# Patient Record
Sex: Male | Born: 2005 | Race: Black or African American | Hispanic: No | Marital: Single | Smoking: Never smoker
Health system: Southern US, Community
[De-identification: ages and names within clinical notes are randomized; demographics above are authoritative.]

## PROBLEM LIST (undated history)

## (undated) DIAGNOSIS — T7840XA Allergy, unspecified, initial encounter: Secondary | ICD-10-CM

## (undated) DIAGNOSIS — J45909 Unspecified asthma, uncomplicated: Secondary | ICD-10-CM

## (undated) HISTORY — DX: Allergy, unspecified, initial encounter: T78.40XA

---
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
## 2023-01-07 ENCOUNTER — Ambulatory Visit: Payer: Medicaid Other | Admitting: Dermatology

## 2023-01-08 ENCOUNTER — Other Ambulatory Visit: Payer: Self-pay | Admitting: Family Medicine

## 2023-01-08 DIAGNOSIS — J4550 Severe persistent asthma, uncomplicated: Secondary | ICD-10-CM

## 2023-01-08 NOTE — Telephone Encounter (Signed)
Requested Prescriptions  Refused Prescriptions Disp Refills   montelukast (SINGULAIR) 10 MG tablet [Pharmacy Med Name: MONTELUKAST SOD 10 MG TABLET] 90 tablet 1    Sig: TAKE 1 TABLET BY MOUTH EVERYDAY AT BEDTIME     Pulmonology:  Leukotriene Inhibitors Passed - 01/08/2023  8:31 AM      Passed - Valid encounter within last 12 months    Recent Outpatient Visits           4 weeks ago Severe persistent asthma without complication   [place] [facility] Merita Norton T, FNP   10 months ago Encounter for routine child health examination with abnormal findings   [facility] Jacky Kindle, FNP   1 year ago Atopic dermatitis, unspecified type   [facility] Merita Norton T, FNP   1 year ago Intermittent asthma without complication, unspecified asthma severity   [facility] Chrismon, Jodell Cipro, PA-C   2 years ago Routine health maintenance   [facility] Fl[place], Eula Fried, [place]

## 2023-01-12 ENCOUNTER — Ambulatory Visit: Payer: Medicaid Other | Admitting: Dermatology

## 2023-05-31 ENCOUNTER — Ambulatory Visit (INDEPENDENT_AMBULATORY_CARE_PROVIDER_SITE_OTHER): Payer: Medicaid Other | Admitting: Family Medicine

## 2023-05-31 VITALS — BP 123/72 | HR 82 | Ht 74.0 in | Wt 165.0 lb

## 2023-05-31 DIAGNOSIS — R238 Other skin changes: Secondary | ICD-10-CM | POA: Diagnosis not present

## 2023-05-31 DIAGNOSIS — L301 Dyshidrosis [pompholyx]: Secondary | ICD-10-CM | POA: Insufficient documentation

## 2023-05-31 DIAGNOSIS — L300 Nummular dermatitis: Secondary | ICD-10-CM | POA: Insufficient documentation

## 2023-05-31 MED ORDER — DOXYCYCLINE HYCLATE 100 MG PO TABS
100.0000 mg | ORAL_TABLET | Freq: Two times a day (BID) | ORAL | 0 refills | Status: DC
Start: 2023-05-31 — End: 2024-03-22

## 2023-05-31 MED ORDER — CLOBETASOL PROPIONATE 0.05 % EX OINT
1.0000 | TOPICAL_OINTMENT | Freq: Two times a day (BID) | CUTANEOUS | 0 refills | Status: AC
Start: 2023-05-31 — End: ?

## 2023-05-31 NOTE — Progress Notes (Signed)
Established patient visit   Patient: Scott Rojas   DOB: 05/20/2006   17 y.o. Male  MRN: [id number] Visit Date: 05/31/2023  Today's healthcare provider: Jacky Kindle, FNP  Re Introduced to nurse practitioner role and practice setting.  All questions answered.  Discussed provider/patient relationship and expectations.  Subjective    HPI HPI   Pt stated-- had tattoo done in August-- left hand itching, bump trying pop--tried hydrocortisone.. Last edited by Shelly Bombard, CMA on 05/31/2023  2:28 PM.      Ongoing symptoms since 8/24; reports some pus filled fluid and bleeding with expression. 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 Mom presents with patient with concerns for 'hand amputation' if not treated  Medications: Outpatient Medications Prior to Visit  Medication Sig   albuterol (PROVENTIL) (2.5 MG/3ML) 0.083% nebulizer solution Take 3 mLs (2.5 mg total) by nebulization every 6 (six) hours as needed for wheezing.   albuterol (VENTOLIN HFA) 108 (90 Base) MCG/ACT inhaler Inhale 1-2 puffs into the lungs every 6 (six) hours as needed for wheezing or shortness of breath.   cetirizine (ZYRTEC) 10 MG tablet Take 1 tablet (10 mg total) by mouth daily.   fluticasone (FLONASE) 50 MCG/ACT nasal spray Place 2 sprays into both nostrils daily.   fluticasone (FLOVENT HFA) 220 MCG/ACT inhaler Inhale 1 puff into the lungs 2 (two) times daily as needed.   fluticasone-salmeterol (ADVAIR DISKUS) 250-50 MCG/ACT AEPB Inhale 1 puff into the lungs in the morning and at bedtime.   montelukast (SINGULAIR) 10 MG tablet Take 1 tablet (10 mg total) by mouth at bedtime.   triamcinolone cream (KENALOG) 0.1 % Apply 1 application. topically at bedtime. Pea size amount after cleaning.   Vitamin D, Ergocalciferol, (DRISDOL) 1.25 MG (50000 UNIT) CAPS capsule TAKE 1 CAPSULE (50,000 UNITS TOTAL) BY MOUTH EVERY 7 (SEVEN) DAYS   No facility-administered medications prior to  visit.     Objective    BP 123/72 (BP Location: Right Arm, Patient Position: Sitting, Cuff Size: Normal)   Pulse 82   Ht 6\' 2"  (1.88 m)   Wt 165 lb (74.8 kg)   SpO2 97%   BMI 21.18 kg/m    Physical Exam Constitutional:      General: He is not in acute distress.    Appearance: Normal appearance. He is normal weight. He is not ill-appearing, toxic-appearing or diaphoretic.  Cardiovascular:     Rate and Rhythm: Normal rate.  Pulmonary:     Effort: Pulmonary effort is normal.  Skin:    General: Skin is dry.     Capillary Refill: Capillary refill takes less than 2 seconds.     Findings: Lesion and rash present.     Comments: See L hand photo  Neurological:     General: No focal deficit present.     Mental Status: He is alert and oriented to person, place, and time. Mental status is at baseline.     No results found for any visits on 05/31/23.  Assessment & Plan     Problem List Items Addressed This Visit       Musculoskeletal and Integument   Dyshidrotic eczema - Primary   Relevant Medications   doxycycline (VIBRA-TABS) 100 MG tablet   Other Visit Diagnoses     Irritation symptom of skin       Relevant Medications   doxycycline (VIBRA-TABS) 100 MG tablet   clobetasol ointment (TEMOVATE) 0.05 %  Acute self limiting; denies other areas of concerns Recommend topical steroid and ABX Known hx of other eczema symptoms and known asthma Denies fevers or chills; however, notes expressed pus from hand/fingers.  Return if symptoms worsen or fail to improve.     Leilani Merl, FNP, have reviewed all documentation for this visit. The documentation on 05/31/23 for the exam, diagnosis, procedures, and orders are all accurate and complete.  Jacky Kindle, FNP  [facility] [phone] (phone) [phone] (fax)  [facility]

## 2023-06-01 ENCOUNTER — Encounter: Payer: Self-pay | Admitting: Family Medicine

## 2023-06-01 DIAGNOSIS — J4520 Mild intermittent asthma, uncomplicated: Secondary | ICD-10-CM

## 2023-06-01 MED ORDER — ALBUTEROL SULFATE (2.5 MG/3ML) 0.083% IN NEBU
2.5000 mg | INHALATION_SOLUTION | Freq: Four times a day (QID) | RESPIRATORY_TRACT | 1 refills | Status: DC | PRN
Start: 2023-06-01 — End: 2024-03-22

## 2023-09-28 ENCOUNTER — Encounter (INDEPENDENT_AMBULATORY_CARE_PROVIDER_SITE_OTHER): Payer: Self-pay

## 2023-10-11 ENCOUNTER — Encounter (INDEPENDENT_AMBULATORY_CARE_PROVIDER_SITE_OTHER): Payer: Self-pay

## 2024-02-16 ENCOUNTER — Telehealth: Payer: Self-pay

## 2024-02-16 NOTE — Telephone Encounter (Signed)
 Copied from CRM [id number]. Topic: Clinical - Medical Advice >> Feb 16, 2024  9:46 AM Emylou G wrote: Reason for CRM: Mom called.. wanted to make sure he is up to date for phys, yearly checkup and or bloodwork / vaccinations

## 2024-02-16 NOTE — Telephone Encounter (Signed)
 Due for CPE, please schedule appt

## 2024-03-22 ENCOUNTER — Ambulatory Visit (INDEPENDENT_AMBULATORY_CARE_PROVIDER_SITE_OTHER): Admitting: Family Medicine

## 2024-03-22 ENCOUNTER — Encounter: Payer: Self-pay | Admitting: Family Medicine

## 2024-03-22 VITALS — BP 132/75 | HR 68 | Resp 16 | Ht 74.0 in | Wt 176.9 lb

## 2024-03-22 DIAGNOSIS — Z1159 Encounter for screening for other viral diseases: Secondary | ICD-10-CM

## 2024-03-22 DIAGNOSIS — Z0000 Encounter for general adult medical examination without abnormal findings: Secondary | ICD-10-CM

## 2024-03-22 DIAGNOSIS — Z114 Encounter for screening for human immunodeficiency virus [HIV]: Secondary | ICD-10-CM

## 2024-03-22 DIAGNOSIS — E559 Vitamin D deficiency, unspecified: Secondary | ICD-10-CM

## 2024-03-22 DIAGNOSIS — Q386 Other congenital malformations of mouth: Secondary | ICD-10-CM | POA: Diagnosis not present

## 2024-03-22 DIAGNOSIS — Z0001 Encounter for general adult medical examination with abnormal findings: Secondary | ICD-10-CM

## 2024-03-22 DIAGNOSIS — Z136 Encounter for screening for cardiovascular disorders: Secondary | ICD-10-CM

## 2024-03-22 DIAGNOSIS — Z130 Encounter for screening for diseases of the blood and blood-forming organs and certain disorders involving the immune mechanism: Secondary | ICD-10-CM

## 2024-03-22 DIAGNOSIS — J4520 Mild intermittent asthma, uncomplicated: Secondary | ICD-10-CM | POA: Diagnosis not present

## 2024-03-22 DIAGNOSIS — Z789 Other specified health status: Secondary | ICD-10-CM

## 2024-03-22 MED ORDER — PALMERS COCONUT OIL LIP BALM 7.5-4.5 % EX STCK: 1.0000 | Freq: Three times a day (TID) | CUTANEOUS | 11 refills | Status: AC | PRN

## 2024-03-22 MED ORDER — ALBUTEROL SULFATE HFA 108 (90 BASE) MCG/ACT IN AERS: 1.0000 | INHALATION_SPRAY | Freq: Four times a day (QID) | RESPIRATORY_TRACT | 1 refills | Status: DC | PRN

## 2024-03-22 MED ORDER — ALBUTEROL SULFATE (2.5 MG/3ML) 0.083% IN NEBU: 2.5000 mg | INHALATION_SOLUTION | Freq: Four times a day (QID) | RESPIRATORY_TRACT | 1 refills | Status: AC | PRN

## 2024-03-22 NOTE — Patient Instructions (Addendum)
 Recommend using a coconut oil-type lip moisturizer regularly on your lips to help prevent the spots from forming. Could also wipe mildly diluted (with water) apple cider vinegar.

## 2024-03-22 NOTE — Progress Notes (Signed)
 Established patient visit   Patient: Scott Rojas   DOB: November 24, 2005   18 y.o. Male  MRN: [id number] Visit Date: 03/22/2024  Today's healthcare provider: LAURAINE LOISE BUOY, DO   Chief Complaint  Patient presents with   Transitions Of Care   Subjective    HPI [name] is a 18 year old male who presents with recurrent bumps around his lips.  He has been experiencing recurrent small, non-painful bumps around his lips for approximately three months. This is the second episode of prominence. He reports using chapstick infrequently, applying it about once every two days at most, and does not use it daily. No associated symptoms such as pain, fever, or systemic complaints are reported.  He has a history of dyshidrotic eczema and low vitamin D  levels. He is not currently taking vitamin D  supplements. He reports that he previously experienced fatigue and was always sleeping due to working overnight shifts, but recently he has had more energy and does not sleep as much as before. No history of anemia or thyroid  problems is noted.  His family history is significant for thyroid  issues, as his mother has thyroid  problems. His sisters have tested negative for thyroid  issues.  Socially, he works at an [employer], engaging in physical activity through his job. He does not smoke cigarettes or drink alcohol, having tried alcohol only once on his 20th birthday. He does not use marijuana regularly.      Medications: Outpatient Medications Prior to Visit  Medication Sig   cetirizine  (ZYRTEC ) 10 MG tablet Take 1 tablet (10 mg total) by mouth daily.   clobetasol  ointment (TEMOVATE ) 0.05 % Apply 1 Application topically 2 (two) times daily.   fluticasone  (FLONASE ) 50 MCG/ACT nasal spray Place 2 sprays into both nostrils daily.   fluticasone  (FLOVENT  HFA) 220 MCG/ACT inhaler Inhale 1 puff into the lungs 2 (two) times daily as needed.   fluticasone -salmeterol (ADVAIR DISKUS)  250-50 MCG/ACT AEPB Inhale 1 puff into the lungs in the morning and at bedtime.   montelukast  (SINGULAIR ) 10 MG tablet Take 1 tablet (10 mg total) by mouth at bedtime.   triamcinolone  cream (KENALOG ) 0.1 % Apply 1 application. topically at bedtime. Pea size amount after cleaning.   Vitamin D , Ergocalciferol , (DRISDOL ) 1.25 MG (50000 UNIT) CAPS capsule TAKE 1 CAPSULE (50,000 UNITS TOTAL) BY MOUTH EVERY 7 (SEVEN) DAYS   [DISCONTINUED] albuterol  (PROVENTIL ) (2.5 MG/3ML) 0.083% nebulizer solution Take 3 mLs (2.5 mg total) by nebulization every 6 (six) hours as needed for wheezing.   [DISCONTINUED] albuterol  (VENTOLIN  HFA) 108 (90 Base) MCG/ACT inhaler Inhale 1-2 puffs into the lungs every 6 (six) hours as needed for wheezing or shortness of breath.   [DISCONTINUED] doxycycline  (VIBRA -TABS) 100 MG tablet Take 1 tablet (100 mg total) by mouth 2 (two) times daily.   No facility-administered medications prior to visit.    Review of Systems  Constitutional:  Negative for appetite change, chills, fatigue and fever.  HENT:  Negative for congestion, ear pain, hearing loss, nosebleeds and trouble swallowing.   Eyes:  Negative for pain and visual disturbance.  Respiratory:  Negative for cough, chest tightness and shortness of breath.   Cardiovascular:  Negative for chest pain, palpitations and leg swelling.  Gastrointestinal:  Negative for abdominal pain, blood in stool, constipation, diarrhea, nausea and vomiting.  Endocrine: Negative for polydipsia, polyphagia and polyuria.  Genitourinary:  Negative for dysuria and flank pain.  Musculoskeletal:  Negative for arthralgias, back pain, joint  swelling, myalgias and neck stiffness.  Skin:  Negative for color change, rash and wound.  Neurological:  Negative for dizziness, tremors, seizures, speech difficulty, weakness, light-headedness and headaches.  Psychiatric/Behavioral:  Negative for behavioral problems, confusion, decreased concentration, dysphoric mood  and sleep disturbance. The patient is not nervous/anxious.   All other systems reviewed and are negative.       Objective    BP 132/75 (BP Location: Right Arm, Patient Position: Sitting, Cuff Size: Normal)   Pulse 68   Resp 16   Ht 6' 2 (1.88 m)   Wt 176 lb 14.4 oz (80.2 kg)   SpO2 99%   BMI 22.71 kg/m     Physical Exam Vitals and nursing note reviewed.  Constitutional:      General: He is awake.     Appearance: Normal appearance.  HENT:     Head: Normocephalic and atraumatic.     Right Ear: Tympanic membrane, ear canal and external ear normal.     Left Ear: Tympanic membrane, ear canal and external ear normal.     Nose: Nose normal.     Mouth/Throat:     Mouth: Mucous membranes are moist.     Pharynx: Oropharynx is clear. No oropharyngeal exudate or posterior oropharyngeal erythema.  Eyes:     General: No scleral icterus.    Extraocular Movements: Extraocular movements intact.     Conjunctiva/sclera: Conjunctivae normal.     Pupils: Pupils are equal, round, and reactive to light.  Neck:     Thyroid : No thyromegaly or thyroid  tenderness.  Cardiovascular:     Rate and Rhythm: Normal rate and regular rhythm.     Pulses: Normal pulses.     Heart sounds: Normal heart sounds.  Pulmonary:     Effort: Pulmonary effort is normal. No tachypnea, bradypnea or respiratory distress.     Breath sounds: Normal breath sounds. No stridor. No wheezing, rhonchi or rales.  Abdominal:     General: Bowel sounds are normal. There is no distension.     Palpations: Abdomen is soft. There is no mass.     Tenderness: There is no abdominal tenderness. There is no guarding.     Hernia: No hernia is present.  Musculoskeletal:     Cervical back: Normal range of motion and neck supple.     Right lower leg: No edema.     Left lower leg: No edema.  Lymphadenopathy:     Cervical: No cervical adenopathy.  Skin:    General: Skin is warm and dry.     Comments: Small white, nontender bumps  across top of upper lip edge.   Neurological:     Mental Status: He is alert and oriented to person, place, and time. Mental status is at baseline.  Psychiatric:        Mood and Affect: Mood normal.        Behavior: Behavior normal.      No results found for any visits on 03/22/24.  Assessment & Plan    Annual physical exam  Transition of care  Fordyce spots of lip -     [place] Coconut Oil Lip Balm; Apply 1 Application topically 3 (three) times daily as needed.  Dispense: 4 g; Refill: 11  Vitamin D  deficiency -     VITAMIN D  25 Hydroxy (Vit-D Deficiency, Fractures)  Encounter for screening for cardiovascular disorders -     Lipid panel  Screening for endocrine, metabolic and immunity disorder -     Comprehensive  metabolic panel with GFR  Encounter for hepatitis C screening test for low risk patient -     Hepatitis C antibody  Encounter for screening for HIV -     HIV Antibody (routine testing w rflx)  Mild intermittent asthma without complication -     Albuterol  Sulfate; Take 3 mLs (2.5 mg total) by nebulization every 6 (six) hours as needed for wheezing.  Dispense: 150 mL; Refill: 1 -     Albuterol  Sulfate HFA; Inhale 1-2 puffs into the lungs every 6 (six) hours as needed for wheezing or shortness of breath.  Dispense: 17 g; Refill: 1       Annual physical exam; transition of care Physical exam overall unremarkable except as noted above. Routine lab work ordered as noted.   Discussed recommended vaccines and smoking risks. - Advise against smoking, including marijuana. - Patient declined all vaccines today.  Fordyce spots of lip Intermittent small white, nontender bumps across top of upper lip edge, inconsistent with herpes. Infrequent chapstick use may contribute. - Recommend regular chapstick use. - Advise moisturizing with cocoa butter or Vaseline.  Mild intermittent asthma without complication Discussed pneumonia vaccine. - Declined pneumonia  vaccine.  Vitamin D  deficiency No vitamin D  supplementation despite previous low levels.  Will recheck level today.     Return in about 1 year (around 03/22/2025) for CPE.      I discussed the assessment and treatment plan with the patient  The patient was provided an opportunity to ask questions and all were answered. The patient agreed with the plan and demonstrated an understanding of the instructions.   The patient was advised to call back or seek an in-person evaluation if the symptoms worsen or if the condition fails to improve as anticipated.    LAURAINE LOISE BUOY, DO  [facility] [phone] (phone) [phone] (fax)  [facility]

## 2024-03-23 ENCOUNTER — Encounter: Payer: Self-pay | Admitting: Family Medicine

## 2024-03-23 DIAGNOSIS — J4520 Mild intermittent asthma, uncomplicated: Secondary | ICD-10-CM

## 2024-03-23 DIAGNOSIS — J4550 Severe persistent asthma, uncomplicated: Secondary | ICD-10-CM

## 2024-03-23 LAB — COMPREHENSIVE METABOLIC PANEL WITH GFR
ALT: 11 IU/L (ref 0–44)
AST: 20 IU/L (ref 0–40)
Albumin: 4.7 g/dL (ref 4.3–5.2)
Alkaline Phosphatase: 72 IU/L (ref 51–125)
BUN/Creatinine Ratio: 13 (ref 9–20)
BUN: 14 mg/dL (ref 6–20)
Bilirubin Total: 0.6 mg/dL (ref 0.0–1.2)
CO2: 22 mmol/L (ref 20–29)
Calcium: 9.8 mg/dL (ref 8.7–10.2)
Chloride: 102 mmol/L (ref 96–106)
Creatinine, Ser: 1.07 mg/dL (ref 0.76–1.27)
Globulin, Total: 2.5 g/dL (ref 1.5–4.5)
Glucose: 59 mg/dL — ABNORMAL LOW (ref 70–99)
Potassium: 4 mmol/L (ref 3.5–5.2)
Sodium: 141 mmol/L (ref 134–144)
Total Protein: 7.2 g/dL (ref 6.0–8.5)
eGFR: 102 mL/min/1.73 (ref >59)

## 2024-03-23 LAB — LIPID PANEL
Chol/HDL Ratio: 2.1 ratio (ref 0.0–5.0)
Cholesterol, Total: 144 mg/dL (ref 100–199)
HDL: 70 mg/dL (ref >39)
LDL Chol Calc (NIH): 64 mg/dL (ref 0–99)
Triglycerides: 44 mg/dL (ref 0–149)
VLDL Cholesterol Cal: 10 mg/dL (ref 5–40)

## 2024-03-23 LAB — HIV ANTIBODY (ROUTINE TESTING W REFLEX): HIV Screen 4th Generation wRfx: NONREACTIVE

## 2024-03-23 LAB — HEPATITIS C ANTIBODY: Hep C Virus Ab: NONREACTIVE

## 2024-03-23 LAB — VITAMIN D 25 HYDROXY (VIT D DEFICIENCY, FRACTURES): Vit D, 25-Hydroxy: 12.5 ng/mL — ABNORMAL LOW (ref 30.0–100.0)

## 2024-03-24 ENCOUNTER — Other Ambulatory Visit: Payer: Self-pay

## 2024-03-27 ENCOUNTER — Ambulatory Visit: Payer: Self-pay | Admitting: Family Medicine

## 2024-03-27 DIAGNOSIS — E559 Vitamin D deficiency, unspecified: Secondary | ICD-10-CM

## 2024-03-27 MED ORDER — VITAMIN D (ERGOCALCIFEROL) 1.25 MG (50000 UNIT) PO CAPS: 50000.0000 [IU] | ORAL_CAPSULE | ORAL | 1 refills | Status: AC

## 2024-03-29 MED ORDER — ALBUTEROL SULFATE HFA 108 (90 BASE) MCG/ACT IN AERS: 1.0000 | INHALATION_SPRAY | Freq: Four times a day (QID) | RESPIRATORY_TRACT | 3 refills | Status: AC | PRN

## 2024-03-29 MED ORDER — SYMBICORT 160-4.5 MCG/ACT IN AERO
2.0000 | INHALATION_SPRAY | Freq: Two times a day (BID) | RESPIRATORY_TRACT | 3 refills | Status: AC
Start: 2024-03-29 — End: ?

## 2024-04-12 ENCOUNTER — Encounter: Payer: Self-pay | Admitting: Family Medicine

## 2024-04-12 DIAGNOSIS — N4830 Priapism, unspecified: Secondary | ICD-10-CM

## 2024-04-12 DIAGNOSIS — S3994XA Unspecified injury of external genitals, initial encounter: Secondary | ICD-10-CM

## 2024-04-13 MED ORDER — IBUPROFEN 600 MG PO TABS: ORAL_TABLET | ORAL | 0 refills | Status: AC

## 2024-04-18 NOTE — Progress Notes (Signed)
    Chief Complaint: Pain in penis  History of Present Illness:  Scott Rojas is a 18 y.o. male who is seen in consultation from Pardue, [name], DO for evaluation of discomfort in his penis and burning recently.  It started about 2 weeks ago after having vigorous nonpenetrative sex with a girlfriend.  That activity went on for about 10 minutes.  The next day he started having discomfort in his proximal and distal penis with some dysuria.  He has been having some discharge as well.  No prior history of STDs.  He has had no curvature with erections.  His urinary stream is spraying a bit.   Past Medical History:  Past Medical History:  Diagnosis Date   Allergy    Asthma     Past Surgical History:  Past Surgical History:  Procedure Laterality Date   APPENDECTOMY  04/26/2014   LAPAROSCOPIC APPENDECTOMY N/A 04/26/2013   Procedure: APPENDECTOMY LAPAROSCOPIC;  Surgeon: CHRISTELLA. [name], MD;  Location: [facility] OR;  Service: Pediatrics;  Laterality: N/A;    Allergies:  No Known Allergies  Family History:  Family History  Problem Relation Age of Onset   Heart disease Mother    Asthma Sister    Arthritis Maternal Grandfather    Throat cancer Father     Social History:  Social History   Tobacco Use   Smoking status: Never   Smokeless tobacco: Never  Substance Use Topics   Alcohol use: No   Drug use: No    Review of symptoms:  Constitutional:  Negative for unexplained weight loss, night sweats, fever, chills ENT:  Negative for nose bleeds, sinus pain, painful swallowing CV:  Negative for chest pain, shortness of breath, exercise intolerance, palpitations, loss of consciousness Resp:  Negative for cough, wheezing, shortness of breath GI:  Negative for nausea, vomiting, diarrhea, bloody stools GU:  Positives noted in HPI; otherwise negative for gross hematuria, dysuria, urinary incontinence Neuro:  Negative for seizures, poor balance, limb weakness, slurred speech Psych:   Negative for lack of energy, depression, anxiety Endocrine:  Negative for polydipsia, polyuria, symptoms of hypoglycemia (dizziness, hunger, sweating) Hematologic:  Negative for anemia, purpura, petechia, prolonged or excessive bleeding, use of anticoagulants  Allergic:  Negative for difficulty breathing or choking as a result of exposure to anything; no shellfish allergy; no allergic response (rash/itch) to materials, foods  Physical exam: There were no vitals taken for this visit. GENERAL APPEARANCE:  Well appearing, well developed, well nourished, NAD HEENT: Atraumatic, Normocephalic. NECK: Normal appearance LUNGS: Normal inspiratory and expiratory excursion HEART: Regular Rate ABDOMEN: No inguinal hernias GU: Phallus normal, no lesions. Scrotal skin normal. Testicles/epididymal structures normal. Meatus normal.  No specific tenderness of the penis.  No discharge EXTREMITIES: Moves all extremities well.  Without clubbing, cyanosis, or edema. NEUROLOGIC:  Alert and oriented x 3, normal gait, CN II-XII grossly intact.  MENTAL STATUS:  Appropriate. SKIN:  Warm, dry and intact.    Results:  I have reviewed urinalysis--multiple white cells.  Some bacteria as well.  I have reviewed PSA results  I have reviewed prior imaging  I have reviewed urine culture results  Assessment: Possible STD   Plan: 1.  Rocephin 500 mg administered today  2.  GC screening from urine specimen, also urine culture sent  3.  Doxycycline  sent in

## 2024-04-19 ENCOUNTER — Ambulatory Visit: Admitting: Urology

## 2024-04-19 ENCOUNTER — Ambulatory Visit (INDEPENDENT_AMBULATORY_CARE_PROVIDER_SITE_OTHER): Admitting: Urology

## 2024-04-19 VITALS — BP 133/85 | HR 87 | Ht 74.0 in | Wt 175.0 lb

## 2024-04-19 DIAGNOSIS — N4889 Other specified disorders of penis: Secondary | ICD-10-CM

## 2024-04-19 DIAGNOSIS — R82998 Other abnormal findings in urine: Secondary | ICD-10-CM | POA: Diagnosis not present

## 2024-04-19 DIAGNOSIS — R8281 Pyuria: Secondary | ICD-10-CM

## 2024-04-19 MED ORDER — DOXYCYCLINE HYCLATE 100 MG PO TABS: 100.0000 mg | ORAL_TABLET | Freq: Two times a day (BID) | ORAL | 0 refills | Status: AC

## 2024-04-19 MED ORDER — CEFTRIAXONE SODIUM 500 MG IJ SOLR
1.0000 g | Freq: Once | INTRAMUSCULAR | Status: AC
  Administered 2024-04-19: 1 g via INTRAMUSCULAR

## 2024-04-19 NOTE — Progress Notes (Signed)
 IM Injection  Patient is present today for an IM Injection for treatment of infection. Drug: Ceftriaxone Dose:1g Location: Right outter buttock Lot: [id number] Exp:11/2025 Patient tolerated well, no complications were noted  Performed by: C. Koleen, LPN

## 2024-04-20 LAB — URINALYSIS, ROUTINE W REFLEX MICROSCOPIC
Bilirubin, UA: NEGATIVE
Glucose, UA: NEGATIVE
Ketones, UA: NEGATIVE
Nitrite, UA: NEGATIVE
Specific Gravity, UA: 1.02 (ref 1.005–1.030)
Urobilinogen, Ur: 2 mg/dL — ABNORMAL HIGH (ref 0.2–1.0)
pH, UA: 7.5 (ref 5.0–7.5)

## 2024-04-20 LAB — MICROSCOPIC EXAMINATION: WBC, UA: >30 /HPF — AB (ref 0–5)

## 2024-04-21 ENCOUNTER — Ambulatory Visit: Payer: Self-pay | Admitting: Urology

## 2024-04-21 LAB — URINE CULTURE: Organism ID, Bacteria: NO GROWTH

## 2024-04-22 LAB — CHLAMYDIA/GONOCOCCUS/TRICHOMONAS, NAA
Chlamydia by NAA: NEGATIVE
Gonococcus by NAA: POSITIVE — AB
Trich vag by NAA: NEGATIVE

## 2024-04-24 NOTE — Progress Notes (Signed)
 I called patient with positive results of gonorrhea.  I told him I will be reporting this to [facility]
